# Patient Record
Sex: Female | Born: 1990 | Hispanic: Yes | Marital: Married | Smoking: Never smoker
Health system: Southern US, Community
[De-identification: ages and names within clinical notes are randomized; demographics above are authoritative.]

---
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
## 2012-03-05 ENCOUNTER — Encounter: Payer: Self-pay | Admitting: Obstetrics & Gynecology

## 2012-03-05 ENCOUNTER — Ambulatory Visit (INDEPENDENT_AMBULATORY_CARE_PROVIDER_SITE_OTHER): Payer: Self-pay | Admitting: Obstetrics & Gynecology

## 2012-03-05 VITALS — BP 120/73 | HR 60 | Temp 97.9°F | Ht 61.0 in | Wt 127.5 lb

## 2012-03-05 DIAGNOSIS — N926 Irregular menstruation, unspecified: Secondary | ICD-10-CM

## 2012-03-05 DIAGNOSIS — L680 Hirsutism: Secondary | ICD-10-CM

## 2012-03-05 NOTE — Progress Notes (Signed)
Patient ID: Alyssa Lynch, female   DOB: 08-Mar-1991, 21 y.o.   MRN: [id number]  Chief Complaint  Patient presents with  . Abnormal bleeding    HPI Alyssa Lynch is a 21 y.o. female.  [id number] Patient's last menstrual period was 02/29/2012. Since menarche at age 51 she has had irregular menses occasionally skipping a month or 2. She's been married for over a year and a half and used no contraception and has been unable to conceive. Her husband also has no children. There is no history of sexually-transmitted diseases.  HPI  History reviewed. No pertinent past medical history.  History reviewed. No pertinent past surgical history.  History reviewed. No pertinent family history.  Social History History  Substance Use Topics  . Smoking status: Never Smoker   . Smokeless tobacco: Never Used  . Alcohol Use: Yes     socially    No Known Allergies  Current Outpatient Prescriptions  Medication Sig Dispense Refill  . Ibuprofen (MIDOL) 200 MG CAPS Take 1 capsule by mouth.        Review of Systems Review of Systems  Constitutional: Negative for activity change and fatigue.  Gastrointestinal: Negative for abdominal pain.  Genitourinary: Negative for vaginal discharge, vaginal pain and dyspareunia.  Skin:       She has some facial hirsutism and she has had electrolysis in the past.  Neurological: Negative for headaches.  Psychiatric/Behavioral: Negative for agitation.    Blood pressure 120/73, pulse 60, temperature 97.9 F (36.6 C), height 5\' 1"  (1.549 m), weight 127 lb 8 oz (57.834 kg), last menstrual period 02/29/2012.  Physical Exam Physical Exam  Constitutional: She is oriented to person, place, and time. She appears well-developed and well-nourished. No distress.  Pulmonary/Chest: Effort normal.  Abdominal: Soft. She exhibits no distension and no mass. There is no tenderness.  Genitourinary: Vagina normal and uterus normal. No vaginal discharge (light to moderate  menstrual bleeding. No adnexal masses) found.  Neurological: She is alert and oriented to person, place, and time.  Skin: Skin is warm and dry.  Psychiatric: She has a normal mood and affect.    Data Reviewed   Assessment    Irregular menses, facial hirsutism. Patient may be oligo-ovulatory. We will check for serum androgens and TSH    Plan    Return to clinic in one month and review her lab tests and address for infertility issues. For now she would prefer not to be on oral contraceptives for cycle control.       ARNOLD,JAMES 03/05/2012, 4:15 PM

## 2012-03-05 NOTE — Patient Instructions (Signed)

## 2012-03-05 NOTE — Progress Notes (Signed)
States has abnormal periods- usually skips a month- has always been that way since periods started.   States has been trying to get pregnant for  A year and a half.

## 2012-03-06 LAB — DHEA-SULFATE: DHEA-SO4: 377 ug/dL (ref 35–430)

## 2012-03-06 LAB — TSH: TSH: 0.818 u[IU]/mL (ref 0.350–4.500)

## 2012-04-04 ENCOUNTER — Encounter: Payer: Self-pay | Admitting: Obstetrics and Gynecology

## 2012-04-23 ENCOUNTER — Encounter: Payer: Self-pay | Admitting: Obstetrics & Gynecology

## 2012-04-23 ENCOUNTER — Ambulatory Visit (INDEPENDENT_AMBULATORY_CARE_PROVIDER_SITE_OTHER): Payer: Self-pay | Admitting: Obstetrics & Gynecology

## 2012-04-23 VITALS — BP 135/89 | HR 77 | Temp 97.0°F | Ht 62.0 in | Wt 124.5 lb

## 2012-04-23 DIAGNOSIS — N970 Female infertility associated with anovulation: Secondary | ICD-10-CM

## 2012-04-23 MED ORDER — METFORMIN HCL ER 500 MG PO TB24: ORAL_TABLET | ORAL | Status: DC

## 2012-04-23 MED ORDER — MEDROXYPROGESTERONE ACETATE 10 MG PO TABS: ORAL_TABLET | ORAL | Status: DC

## 2012-04-23 NOTE — Progress Notes (Signed)
  Subjective:    Patient ID: Alyssa Lynch, female    DOB: 1991-06-06, 21 y.o.   MRN: [id number]  HPI  Alyssa Lynch is a 21 yo MH G0 who is here for infertility. She and her husband have been having unprotected sex for about 4 years. Her periods are quite irregular. Dr. Debroah Loop ordered TSH and test. Studies at her last visit. They were normal.  Review of Systems     Objective:   Physical Exam        Assessment & Plan:  Infertility. I have sent a prescription to a [facility] office for her husband to have a vasectomy. She has instructions. In the meantime, I will give her provera the first 10 days of each month to regulate her bleeding. She will also start metformin, gradually increasing her dose to reach a goal of 1000 mg BID

## 2012-09-29 ENCOUNTER — Encounter (HOSPITAL_COMMUNITY): Payer: Self-pay | Admitting: *Deleted

## 2012-09-29 ENCOUNTER — Emergency Department (HOSPITAL_COMMUNITY)
Admission: EM | Admit: 2012-09-29 | Discharge: 2012-09-29 | Disposition: A | Discharge status: Home / Self Care | Payer: No Typology Code available for payment source | Attending: Emergency Medicine | Admitting: Emergency Medicine

## 2012-09-29 DIAGNOSIS — M546 Pain in thoracic spine: Secondary | ICD-10-CM

## 2012-09-29 DIAGNOSIS — Y92410 Unspecified street and highway as the place of occurrence of the external cause: Secondary | ICD-10-CM | POA: Insufficient documentation

## 2012-09-29 DIAGNOSIS — Y9389 Activity, other specified: Secondary | ICD-10-CM | POA: Insufficient documentation

## 2012-09-29 DIAGNOSIS — IMO0002 Reserved for concepts with insufficient information to code with codable children: Secondary | ICD-10-CM | POA: Insufficient documentation

## 2012-09-29 DIAGNOSIS — Z79899 Other long term (current) drug therapy: Secondary | ICD-10-CM | POA: Insufficient documentation

## 2012-09-29 MED ORDER — IBUPROFEN 600 MG PO TABS: 600.0000 mg | ORAL_TABLET | Freq: Three times a day (TID) | ORAL | Status: DC | PRN
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
## 2014-08-16 ENCOUNTER — Ambulatory Visit: Payer: Self-pay | Admitting: Gynecology

## 2014-09-07 ENCOUNTER — Ambulatory Visit: Payer: Self-pay | Admitting: Gynecology

## 2014-09-24 ENCOUNTER — Ambulatory Visit: Payer: Self-pay | Admitting: Gynecology

## 2014-10-13 ENCOUNTER — Ambulatory Visit (INDEPENDENT_AMBULATORY_CARE_PROVIDER_SITE_OTHER): Payer: Self-pay | Admitting: Gynecology

## 2014-10-13 ENCOUNTER — Other Ambulatory Visit (HOSPITAL_COMMUNITY)
Admission: RE | Admit: 2014-10-13 | Discharge: 2014-10-13 | Disposition: A | Discharge status: Home / Self Care | Payer: No Typology Code available for payment source | Source: Ambulatory Visit | Attending: Gynecology | Admitting: Gynecology

## 2014-10-13 ENCOUNTER — Encounter: Payer: Self-pay | Admitting: Gynecology

## 2014-10-13 VITALS — BP 130/86 | Ht 61.25 in | Wt 145.0 lb

## 2014-10-13 DIAGNOSIS — L680 Hirsutism: Secondary | ICD-10-CM

## 2014-10-13 DIAGNOSIS — N912 Amenorrhea, unspecified: Secondary | ICD-10-CM

## 2014-10-13 DIAGNOSIS — Z113 Encounter for screening for infections with a predominantly sexual mode of transmission: Secondary | ICD-10-CM

## 2014-10-13 DIAGNOSIS — Z01419 Encounter for gynecological examination (general) (routine) without abnormal findings: Secondary | ICD-10-CM

## 2014-10-13 DIAGNOSIS — N979 Female infertility, unspecified: Secondary | ICD-10-CM

## 2014-10-13 LAB — CBC WITH DIFFERENTIAL/PLATELET
Basophils Absolute: 0 10*3/uL (ref 0.0–0.1)
Basophils Relative: 0 % (ref 0–1)
EOS PCT: 1 % (ref 0–5)
Eosinophils Absolute: 0.1 10*3/uL (ref 0.0–0.7)
HCT: 42 % (ref 36.0–46.0)
Hemoglobin: 14.2 g/dL (ref 12.0–15.0)
Lymphocytes Relative: 34 % (ref 12–46)
Lymphs Abs: 2 10*3/uL (ref 0.7–4.0)
MCH: 27 pg (ref 26.0–34.0)
MCHC: 33.8 g/dL (ref 30.0–36.0)
MCV: 79.8 fL (ref 78.0–100.0)
MONO ABS: 0.4 10*3/uL (ref 0.1–1.0)
MONOS PCT: 6 % (ref 3–12)
MPV: 9.7 fL (ref 8.6–12.4)
NEUTROS ABS: 3.5 10*3/uL (ref 1.7–7.7)
Neutrophils Relative %: 59 % (ref 43–77)
PLATELETS: 255 10*3/uL (ref 150–400)
RBC: 5.26 MIL/uL — ABNORMAL HIGH (ref 3.87–5.11)
RDW: 14.1 % (ref 11.5–15.5)
WBC: 6 10*3/uL (ref 4.0–10.5)

## 2014-10-13 LAB — PREGNANCY, URINE: Preg Test, Ur: NEGATIVE

## 2014-10-13 LAB — TSH: TSH: 1.536 u[IU]/mL (ref 0.350–4.500)

## 2014-10-13 MED ORDER — MEDROXYPROGESTERONE ACETATE 10 MG PO TABS: ORAL_TABLET | ORAL | Status: DC

## 2014-10-13 MED ORDER — DOXYCYCLINE HYCLATE 100 MG PO CAPS: 100.0000 mg | ORAL_CAPSULE | Freq: Two times a day (BID) | ORAL | Status: DC

## 2014-10-13 NOTE — Patient Instructions (Signed)
Histerosalpingografa (Hysterosalpingography) La histerosalpingografa es un procedimiento para observar el interior del tero y las trompas de Falopio. Durante este procedimiento, se inyecta una sustancia de contraste en el tero, a travs de la vagina y el cuello del tero para poder visualizar el tero mientras se toman imgenes radiogrficas. Este procedimiento puede ayudar al mdico a diagnosticar tumores, adherencias o anormalidades estructurales en el tero. Generalmente, se utiliza para determinar las razones por las que una mujer no puede tener hijos (infertilidad). El procedimiento suele durar entre 15 y 30 minutos. INFORME A SU MDICO:  Cualquier alergia que tenga.  Todos los medicamentos que utiliza, incluidos vitaminas, hierbas, gotas oftlmicas, cremas y medicamentos de venta libre.  Problemas previos que usted o los miembros de su familia hayan tenido con el uso de anestsicos.  Enfermedades de la sangre.  Cirugas previas.  Afecciones mdicas que tenga. RIESGOS Y COMPLICACIONES  En general, se trata de un procedimiento seguro. Sin embargo, como en cualquier procedimiento, pueden surgir problemas. Estos son algunos posibles problemas:  Infeccin en el revestimiento interior del tero (endometritis) o en las trompas de Falopio (salpingitis).  Dao o perforacin del tero o las trompas de Falopio.  Reaccin alrgica a la sustancia de contraste utilizada para tomar la radiografa. ANTES DEL PROCEDIMIENTO   Debe planificar el procedimiento para despus que finalice su perodo, pero antes de su prxima ovulacin. Esto ocurre por lo general entre los das 5 y 10 de su ltimo perodo. El Da 1 es el primer da de su perodo.  Consulte a su mdico si debe cambiar o suspender los medicamentos que toma habitualmente.  Podr comer y beber normalmente.  Antes de comenzar el procedimiento, vace la vejiga. PROCEDIMIENTO  Es posible que le administren un medicamento para relajarse  (sedante) o un analgsico de venta libre para aliviar las molestias durante el procedimiento.  Deber acostarse en una mesa de radiografas con los pies en los estribos.  Le colocarn en la vagina un dispositivo llamado espculo. Esto permite al mdico observar el interior de la vagina hasta el cuello del tero.  El cuello del tero se lava con un jabn especial.  Luego se pasa un tubo delgado y flexible a travs del cuello del tero hasta el tero.  Por el tubo se colocar una sustancia de contraste.  Le tomarn varias radiografas a medida que el contraste pasa a travs del tero y las trompas de Falopio.  Despus del procedimiento se retira el tubo. DESPUS DEL PROCEDIMIENTO   La mayor parte de la sustancia de contraste se eliminar de la vagina naturalmente. Puede ser necesario que use un apsito sanitario.  Puede sentir clicos leves y notar una hemorragia vaginal leve. Luego de 24 horas deben desaparecer.  Pregunte cundo estarn listos los resultados. Asegrese de obtener los resultados. Document Released: 10/15/2011 Document Revised: 07/28/2013 ExitCare Patient Information 2015 [employer]. This information is not intended to replace advice given to you by your health care provider. Make sure you discuss any questions you have with your health care provider. Infertilidad (Infertility) QU ES LA INFERTILIDAD?  La infertilidad normalmente se define como la incapacidad para quedar embarazada luego de un ao de relaciones sexuales regulares sin la utilizacin de mtodos anticonceptivos. O la incapacidad de llevar a trmino un embarazo y tener el beb. La tasa de infertilidad en los [place] es de alrededor del 10%. El embarazo es el resultado de una cadena de sucesos. La mujer debe liberar el vulo de uno de sus ovarios (ovulacin).   El vulo debe fertilizarse con el esperma. Luego viaja a travs de las trompas de Falopio hacia el tero (matriz), donde se une a la pared del  tero y crece. El hombre debe tener suficiente esperma y el esperma debe unirse con el vulo (fertilizar) en el momento justo. El vulo fertilizado debe luego unirse al interior del tero. Esto parece simple, pero pueden ocurrir muchas cosas que evitan que el embarazo se produzca.  DE QUIN ES EL PROBLEMA?  Un 20% de los casos de infertilidad se deben a problemas del hombres (factores masculinos) y un 65% se debe a problemas de la mujer (factores femeninos). Otras causas pueden ser una combinacin de factores masculinos y femeninos o a causas desconocidas.  CULES SON LAS CAUSAS DE LA INFERTILIDAD EN EL HOMBRE?  La infertilidad en el hombre a menudo se debe a problemas para producir el esperma o hacer que el mismo llegue al vulo. Los problemas de esperma pueden existir desde el nacimiento o desarrollarse ms tarde debido a enfermedades o lesiones. Algunos hombres no producen esperma, o producen muy poco (oligospermia). Entre otros problemas se incluyen:  Disfuncin sexual  Problemas hormonales o endocrinos.  La edad. La fertilidad del hombre disminuye con la edad, pero no tan temprano como la femenina.  Infecciones.  Problemas congnitos Defectos de nacimiento, como ausencia de los tubos que transportan el esperma (conductos deferentes).  Problemas genticos o de cromosomas.  Problemas de anticuerpos antiesperma.  Eyaculacin retrgrada (el esperma va hacia la vejiga).  Varicoceles, espermatoceles o tumores en los testculos.  El estilo de vida puede influir en el nmero y la calidad del esperma del hombre.  El alcohol y las drogas pueden reducir temporalmente la calidad del esperma.  Las toxinas ambientales, como los pesticidas y el plomo, pueden ocasionar algunos casos de infertilidad en hombres. CULES SON LAS CAUSAS DE LA INFERTILIDAD EN LA MUJER?   La infertilidad en las mujeres est causada mayormente por problemas con la ovulacin. Sin ovulacin, el vulo no puede  fertilizarse.  Seales de problemas de ovulacin son perodos menstruales irregulares o ningn perodo.  Factores simples del estilo de vida, como el estrs, la dieta, o el entrenamiento deportivo, pueden afectar el balance hormonal de una persona.  La edad. La fertilidad comienza a disminuir en las mujeres alrededor de los 30 aos y empeora a partir de los 37.  Mucho menos a menudo, un desequilibrio hormonal por un problema mdico serio como un tumor en la glndula pituitaria, tiroides u otra enfermedad mdica crnica pueden ocasionar problemas de ovulacin.  Infecciones plvicas.  Sndrome de ovarios poliqusticos (aumento de hormonas masculinas, incapacidad de ovular).  Consumo de alcohol o drogas.  Toxinas ambientales, radiacin, pesticidas y ciertos qumicos.  La edad es un factor importante en la infertilidad femenina.  La capacidad de los ovarios de una mujer para producir vulos disminuye con la edad, especialmente despus de los 35 aos. Alrededor de un tercio de las parejas en las que la mujer tiene ms de 35 aos tendr problemas de infertilidad.  En el momento en el que alcanza la menopausia, cuando su perodo menstrual se detiene, la mujer ya no puede producir vulos ni quedar embarazada.  Otros problemas tambin pueden llevar a la infertilidad en las mujeres. Si las trompas de Falopio estn bloqueadas en uno o los dos extremos, el vulo no puede pasar a travs de los tubos hacia el tero. Tejido cicatrizal (adhesiones) en la pelvis que pueden obstruir las trompas. Esto puede dar como resultado   una enfermedad inflamatoria plvica, endometriosis o una ciruga por embarazo ectpico (en la que el vulo fertilizado se ha implantado fuera del tero) o cualquier ciruga plvica o abdominal que ocasione adherencias.  Tumores fibroides o plipos en el tero.  Anormalidades congnitas (de nacimiento) del tero.  Infecciones en el cuello del tero (cervicitis).  Estenosis cervical  (estrechamiento).  Mucosidad cervical anormal.  Sndrome poliqustico de los ovarios.  Tener relaciones sexuales muy seguidas (todos los das o 4 a 5 veces por semana).  Obesidad.  Anorexia.  Dficit nutricional.  Mucho ejercicio, con prdida de grasa corporal.  DES. Su madre ha recibido la hormona dietilstilbesterol cuando estaba embarazada de usted. CMO SE ANALIZA LA INFERTILIDAD?  Si ha estado tratando de quedar embarazada sin xito, podra querer buscar ayuda mdica. No debera esperar un ao de intentos sin xito antes de buscar ayuda profesional si:  Tiene mas de 35 aos de edad  Tiene razones para creer que podra tener problemas de fertilidad. Un examen mdico determinar las causas de infertilidad de la pareja, Normalmente el proceso comienza con:  Exmenes fsicos  Historias clnicas de ambos miembros de la pareja.  Historiales sexuales de ambos miembros de la pareja. Si no hay un problema evidente, como relaciones sexuales en tiempos inadecuados o ausencia de ovulacin, se necesitarn realizar anlisis.   Para el hombre, los anlisis normalmente comienzan con pruebas de semen para observar:  La cantidad de esperma.  La forma del esperma.  El movimiento del esperma.  Realizar un historial clnico y quirrgico completo.  Examen fsico.  Control de infecciones en los rganos reproductivos. Podrn realizarle anlisis hormonales.   Para la mujer, el primer paso del anlisis es saber si ovula cada mes. Hay diferentes modos de realizar esto. Por ejemplo, puede llevar un registro de los cambios en la temperatura corporal por la maana y la textura de la mucosidad cervical. Otra herramienta es un kit de prueba de ovulacin casero, que puede comprarse en la farmacia.  Tambin pueden realizarse controles de ovulacin en el consultorio mdico, mediante anlisis de sangre para observar los niveles de hormonas o pruebas de ultrasonido en los ovarios. Si la mujer est  ovulando, se necesitarn realizar ms exmenes. Algunas femeninas comunes incluyen:  Histerosalpingografa: Se realizan rayos x de las trompas de Falopio y el tero con una inyeccin de contraste. Mostrar si las trompas estn abiertas y la forma del tero.  Laparoscopa: Un anlisis de las trompas y otros rganos femeninos para encontrar enfermedades. Se utiliza un tubo con iluminacin llamado laparoscopio para observar el interior del abdomen.  Biopsia endometrial: Se toma una muestra del tejido del tero el primer da del perodo menstrual, para ver si el tejido le indica si est ovulando.  Prueba de ultrasonido transvaginal: Examina los rganos femeninos.  Histeroscopa: Utiliza un tubo con iluminacin para examinar el cuello del tero y el tero y ver si hay anormalidades dentro del mismo. TRATAMIENTO Dependiendo de los resultados de las pruebas, se sugerirn tratamientos diferentes. El tratamiento depende de la causa. Entre el 85 y el 90% de los casos de infertilidad se tratan con drogas o ciruga.   Hay varias drogas para la fertilidad que pueden utilizarse para las mujeres con problemas de ovulacin. Es importante hablar con el profesional que la asiste sobre la droga a utilizar. Deber comprender los beneficios y efectos colaterales de las drogas. Segn el tipo de droga para la fertilidad y la dosis utilizada, algunas mujeres podran tener embarazos mltiples (mellizos).  De ser   necesario, se puede realizar una ciruga para reparar daos en los ovarios de la mujer, trompas de Falopio, el cuello del tero o el tero.  Tratamiento quirrgico o mdico para la endometriosis o el sndrome de ovario poliqustico. A veces, los problemas de fertilidad en el hombre pueden corregirse con medicamentos o ciruga.  Inseminacin intrauterina, IUI, de esperma en concordancia con la ovulacin.  Cambios en el estilo de vida, si esta es la causa (prdida de peso, aumento de ejercicios, dejar de fumar,  beber excesivamente o tomar drogas ilegales).  Otros tipos de ciruga:  Extirpar tumores por dentro o sobre el tero.  Eliminar tejido cicatrizal de dentro del tero.  Arreglar trompas obstruidas.  Eliminar tejido cicatrizal en la pelvis y alrededor de los rganos femeninos. QU ES LA TECNOLOGA DE REPRODUCCIN ASISTIDA (ART)?  La tecnologa de reproduccin asistida (ART) utiliza mtodos especiales para ayudar a parejas infrtiles. En esta tcnica se manipula tanto el vulo de la mujer como el esperma del hombre. El xito depende de muchos factores. La ART puede ser cara y demandar mucho tiempo. Pero ha hecho posible que muchas parejas tengan hijos que de otra manera no hubieran podido. A continuacin se enumeran algunos mtodos:  Fertilizacin. In Vitro (FIV). In Vitro (IVF) es un procedimiento que se hizo famoso con el nacimiento en 1978 de Louise Brown, el primer "beb de probeta" del mundo. Se utiliza cuando las trompas de Falopio de la mujer estn bloqueadas o cuando el hombre tiene poco esperma. Se utiliza una droga para estimular los ovarios y producir mltiples vulos. Una vez maduros, los vulos se retiran y se colocan en una placa de cultivo con el esperma del hombre para la fertilizacin. Luego de aproximadamente 40 horas, los vulos se examinan para ver si han sido fertilizados por el esperma y se han dividido en clulas. Esos vulos fertilizados (embriones) se colocan luego en el tero de la mujer. Esto evita el paso por las trompas de Falopio.  La transferencia intrafalopiana de gametas (GIFT) es similar al IVF, pero se utiliza cuando la mujer tiene al menos una trompa de falopio normal. Se colocan de tres a cinco vulos en la trompa de Falopio, junto con el esperma del hombre, para que la fertilizacin se realice dentro del cuerpo de la mujer.  La transferencia intrafalopiana de cigotos (ZIFT), tambin se denomina transferencia embrionaria, y combina el IVF con el GIFT. Los vulos  retirados de los ovarios de la mujer se fertilizan en el laboratorio y se colocan en las trompas de Falopio en lugar de en el tero.  Los procedimientos de ART a menudo suponen la utilizacin de donantes de vulos (de otra mujer) o de embriones congelados previamente. Los donantes de vulos pueden utilizarse si la mujer tiene problemas en los ovarios o posee una enfermedad gentica que podra transmitirla al beb.  Cuando se realiza una ART hay mayor riesgo de embarazos mltiples, mellizos, trillizos o ms.  La inyeccin de esperma intracitoplasmtica es un procedimiento que inyecta un espermatozoide en el vulo para fertilizarlo.  El trasplante embrionario es un procedimiento que comienza con un embrin que se ha desarrollado en un medio especial (solucin qumica) preparado para mantener el embrin vivo por 2 a 5 das, y luego trasplantarlo en el tero. En los casos en los que no puede encontrarse la causa y el embarazo no se produce, podr considerarse la adopcin. Document Released: 08/12/2007 Document Revised: 10/15/2011 Exit[employer] Patient Information 2015 [employer]. This information is not intended to replace   advice given to you by your health care provider. Make sure you discuss any questions you have with your health care provider.

## 2014-10-13 NOTE — Addendum Note (Signed)
Addended by: Berna SpareASTILLO, Reichen Hutzler A on: 10/13/2014 12:29 PM   Modules accepted: Orders

## 2014-10-13 NOTE — Progress Notes (Signed)
Alyssa Lynch 1990/12/10 [id number]   History:    24 y.o.  for annual gyn exam who is a new patient to the practice. Patient complaining of going several months without a menstrual cycle. Patient does not recall when her last menstrual period was. She states that the longest she has gone without a menstrual cycle is 3 months. She has had history of primary infertility as well as hirsutism. It appears that she was placed on metformin for a short time and patient does not recall why. She has never been placed on any ovulation induction medication are undergone any infertility evaluation. She does say that several of her family members do have excessive hair growth. She denies any past history of any STDs or any surgery. Her husband is 24 years of age and has not had children with any one else. She reports having normal development and starting her menstrual cycle the age of 89. She denies any nipple discharge, headaches or blurry vision. We did a urine pregnancy test here in the office today which was negative. It appears that she had a normal total testosterone and DHEAS and TSH back in 2013.  Past medical history,surgical history, family history and social history were all reviewed and documented in the EPIC chart.  Gynecologic History Patient's last menstrual period was 07/14/2014. Contraception: none Last Pap: 2014. Results were: Patient states it was normal Last mammogram: Not indicated. Results were: Not indicated  Obstetric History OB History  Gravida Para Term Preterm AB SAB TAB Ectopic Multiple Living  0 0 0 0 0 0 0 0 0 0          ROS: A ROS was performed and pertinent positives and negatives are included in the history.  GENERAL: No fevers or chills. HEENT: No change in vision, no earache, sore throat or sinus congestion. NECK: No pain or stiffness. CARDIOVASCULAR: No chest pain or pressure. No palpitations. PULMONARY: No shortness of breath, cough or wheeze.  GASTROINTESTINAL: No abdominal pain, nausea, vomiting or diarrhea, melena or bright red blood per rectum. GENITOURINARY: No urinary frequency, urgency, hesitancy or dysuria. MUSCULOSKELETAL: No joint or muscle pain, no back pain, no recent trauma. DERMATOLOGIC: No rash, no itching, no lesions. ENDOCRINE: No polyuria, polydipsia, no heat or cold intolerance. No recent change in weight. HEMATOLOGICAL: No anemia or easy bruising or bleeding. NEUROLOGIC: No headache, seizures, numbness, tingling or weakness. PSYCHIATRIC: No depression, no loss of interest in normal activity or change in sleep pattern.     Exam: chaperone present  BP 130/86 mmHg  Ht 5' 1.25" (1.556 m)  Wt 145 lb (65.772 kg)  BMI 27.17 kg/m2  LMP 07/14/2014  Body mass index is 27.17 kg/(m^2).  General appearance : Well developed well nourished female. No acute distress HEENT: Eyes: no retinal hemorrhage or exudates,  Neck supple, trachea midline, no carotid bruits, no thyroidmegaly Lungs: Clear to auscultation, no rhonchi or wheezes, or rib retractions  Heart: Regular rate and rhythm, no murmurs or gallops Breast:Examined in sitting and supine position were symmetrical in appearance, no palpable masses or tenderness,  no skin retraction, no nipple inversion, no nipple discharge, no skin discoloration, no axillary or supraclavicular lymphadenopathy Abdomen: no palpable masses or tenderness, no rebound or guarding Extremities: no edema or skin discoloration or tenderness  Pelvic:  Bartholin, Urethra, Skene Glands: Within normal limits             Vagina: No gross lesions or discharge  Cervix: No gross lesions or discharge  Uterus  anteverted, normal size, shape and consistency, non-tender and mobile  Adnexa  Without masses or tenderness  Anus and perineum  normal   Rectovaginal  normal sphincter tone without palpated masses or tenderness             Hemoccult not indicated     Assessment/Plan:  24 y.o. female for annual  exam with oligomenorrhea. 2 we are going to check the following blood work: TSH, prolactin, total testosterone, along with CBC and urinalysis. Patient will be prescribed Provera 10 mg to take 1 by mouth daily to jump start her cycle. She will then call the office at that we can schedule an HSG as well as a semen analysis for her husband. She will be prescribed Vibramycin 100 mg take 1 by mouth twice a day for 3 days starting the day before the HSG. She was instructed to begin taking her prenatal vitamins. Literature information was provided on infertility as well as on HSG in Spanish.   Ok Edwards MD, 12:20 PM 10/13/2014

## 2014-10-13 NOTE — Addendum Note (Signed)
Addended by: Berna SpareASTILLO, Jenie Parish A on: 10/13/2014 03:44 PM   Modules accepted: Orders

## 2014-10-14 LAB — URINALYSIS W MICROSCOPIC + REFLEX CULTURE
Bacteria, UA: NONE SEEN
Bilirubin Urine: NEGATIVE
Casts: NONE SEEN
Crystals: NONE SEEN
Glucose, UA: NEGATIVE mg/dL
Hgb urine dipstick: NEGATIVE
Ketones, ur: NEGATIVE mg/dL
LEUKOCYTES UA: NEGATIVE
Nitrite: NEGATIVE
PROTEIN: NEGATIVE mg/dL
SQUAMOUS EPITHELIAL / LPF: NONE SEEN
Specific Gravity, Urine: 1.01 (ref 1.005–1.030)
Urobilinogen, UA: 0.2 mg/dL (ref 0.0–1.0)
pH: 6 (ref 5.0–8.0)

## 2014-10-14 LAB — TESTOSTERONE: Testosterone: 71 ng/dL — ABNORMAL HIGH (ref 10–70)

## 2014-10-14 LAB — PROLACTIN: Prolactin: 5.4 ng/mL

## 2014-10-15 LAB — GC/CHLAMYDIA PROBE AMP
CT Probe RNA: NEGATIVE
GC Probe RNA: NEGATIVE

## 2014-10-18 LAB — CYTOLOGY - PAP

## 2014-10-27 ENCOUNTER — Telehealth: Payer: Self-pay | Admitting: *Deleted

## 2014-10-27 DIAGNOSIS — N979 Female infertility, unspecified: Secondary | ICD-10-CM

## 2014-10-27 NOTE — Telephone Encounter (Signed)
[facility] 8:30am will need to be there at 8:15am to check in. The total out of pocket will be $802.00 which will need to be paid before procedure. Pt can come to [facility] day prior to HSG to have this paid and keep receipt to show the day of procedure so it can be scanned in epic. Will have claudia relay to patient.

## 2014-10-27 NOTE — Telephone Encounter (Signed)
-----   Message from Jerilynn Mageslaudia Shaffer sent at 10/26/2014  3:39 PM EDT ----- Regarding: hsg Contact: [phone] Please schedule HSG for JF patient. Her LMP 10-26-14 and she prefers early morning. She is self pay so can you please inform her of how much she needs to pay. Thx

## 2014-11-02 ENCOUNTER — Telehealth: Payer: Self-pay | Admitting: *Deleted

## 2014-11-02 ENCOUNTER — Ambulatory Visit (HOSPITAL_COMMUNITY)
Admission: RE | Admit: 2014-11-02 | Discharge: 2014-11-02 | Disposition: A | Discharge status: Home / Self Care | Payer: Self-pay | Source: Ambulatory Visit | Attending: Gynecology | Admitting: Gynecology

## 2014-11-02 DIAGNOSIS — N979 Female infertility, unspecified: Secondary | ICD-10-CM | POA: Insufficient documentation

## 2014-11-02 MED ORDER — IOHEXOL 300 MG/ML  SOLN
20.0000 mL | Freq: Once | INTRAMUSCULAR | Status: AC | PRN
  Administered 2014-11-02: 20 mL

## 2014-11-02 NOTE — Telephone Encounter (Signed)
Pt informed with the below note, front desk will contact to have semen analysis done.

## 2014-11-02 NOTE — Telephone Encounter (Signed)
All studies are normal. Did not see Semen analysis has been done. Once we have that we need a consult appointment to discuss ovulation induction treatment.

## 2014-11-02 NOTE — Telephone Encounter (Signed)
Pt HSG results on in epic, pt asked what is the next step? Please advise

## 2014-12-01 ENCOUNTER — Telehealth: Payer: Self-pay | Admitting: *Deleted

## 2014-12-01 NOTE — Telephone Encounter (Signed)
Pt called requesting husband semen analysis which was done today. I informed patient result will need to be faxed here for JF to review, pt will wait to here from us

## 2014-12-03 ENCOUNTER — Encounter: Payer: Self-pay | Admitting: Gynecology

## 2014-12-06 ENCOUNTER — Telehealth: Payer: Self-pay

## 2014-12-06 NOTE — Telephone Encounter (Signed)
I just saw note in Jennifer's inbox that read "Victorino DikeJennifer, please inform patient that I received her husband semen analysis and that it was abnormal. Low concentration and low count of sperm. I would recommend that we repeated in one week after abstinence for 72 hours. Then to make an appointment to see me to discuss all results week after the repeat semen analysis. "  I will contact patient with this.

## 2014-12-06 NOTE — Telephone Encounter (Signed)
Patient informed. [facility] Patient will call them Weds to schedule appt.

## 2014-12-06 NOTE — Telephone Encounter (Signed)
Calling for results of husband's semen analysis.  Was scanned in labs on 12/01/14. What to tell her?

## 2014-12-06 NOTE — Telephone Encounter (Signed)
Thanks

## 2015-05-02 ENCOUNTER — Encounter: Payer: Self-pay | Admitting: Gynecology

## 2015-05-02 ENCOUNTER — Ambulatory Visit (INDEPENDENT_AMBULATORY_CARE_PROVIDER_SITE_OTHER): Payer: Self-pay | Admitting: Gynecology

## 2015-05-02 VITALS — BP 124/70

## 2015-05-02 DIAGNOSIS — N979 Female infertility, unspecified: Secondary | ICD-10-CM

## 2015-05-02 DIAGNOSIS — N469 Male infertility, unspecified: Secondary | ICD-10-CM

## 2015-05-02 DIAGNOSIS — N912 Amenorrhea, unspecified: Secondary | ICD-10-CM

## 2015-05-02 LAB — PREGNANCY, URINE: PREG TEST UR: NEGATIVE

## 2015-05-02 MED ORDER — MEDROXYPROGESTERONE ACETATE 10 MG PO TABS: ORAL_TABLET | ORAL | Status: AC

## 2015-05-02 NOTE — Progress Notes (Signed)
   Patient is a 24 year old gravida 0 who was seen in the office as a new patient for annual exam and complaining of amenorrhea on 10/13/2014. During that office visit her urine pregnancy test was negative and she was prescribed Provera 10 mg to take 1 by mouth daily for 10 days. Her last menstrual period was reported to be April 17. Patient had denied any visual disturbances, headaches or any nipple discharge. Her blood work consisting of a CBC, TSH, prolactin were normal. Her TSH was slightly above normal at 71. In 2013 she had been tested with DHEAS and reported to be normal. She recently had an HSG and her husband had a semen analysis with result as follows:  Semen analysis high-volume, low concentration, low count  Hysterosalpingogram no intracavitary defect bilateral tubal patency with spillage of dye  Urine (today negative  Exam: Abdomen: Soft nontender no rebound or guarding Pelvic: Bartholin urethra Skene glands within normal limits Vagina: No lesions or discharge Cervix: No lesions or discharge Uterus: Anteverted normal size shape and consistency Adnexa: No palpable masses or tenderness Rectal exam not done  Assessment/plan: #1 secondary amenorrhea negative urine pregnancy test will be prescribed Provera 10 mg to take 1 by mouth daily for 10 days every 30 days if she does not have a spontaneous menses. She was instructed to check a urine pregnancy test before taking Provera. #2 patient husband we'll need to have a repeat semen analysis to make sure female factor infertility is not an issue as well as chronic anovulation from his palate.  After the above tests is repeated if still abnormal we'll refer him to urologist before we start ovulation induction medication. Patient was provided to use the ovulation predictor kit meanwhile with ongoing evaluation before ovulation induction medication. Instructions provided.

## 2015-06-12 IMAGING — RF DG HYSTEROGRAM
6 series · 6 of 6 positions shown · IV contrast (omnipaque)
Comparison: None.

FLUOROSCOPY TIME:  0 minutes 42 seconds

Number of acquired images: 6

CLINICAL DATA: Primary infertility.

EXAM:
HYSTEROSALPINGOGRAM
TECHNIQUE: Following cleansing of the cervix and vagina with Betadine solution,
a hysterosalpingogram was performed using a 5-French
hysterosalpingogram catheter and Omnipaque 300 contrast. The patient
tolerated the examination without difficulty.

[Series 1: run · 1 of 1 slices shown (1 of 6)]
[im 1/1]
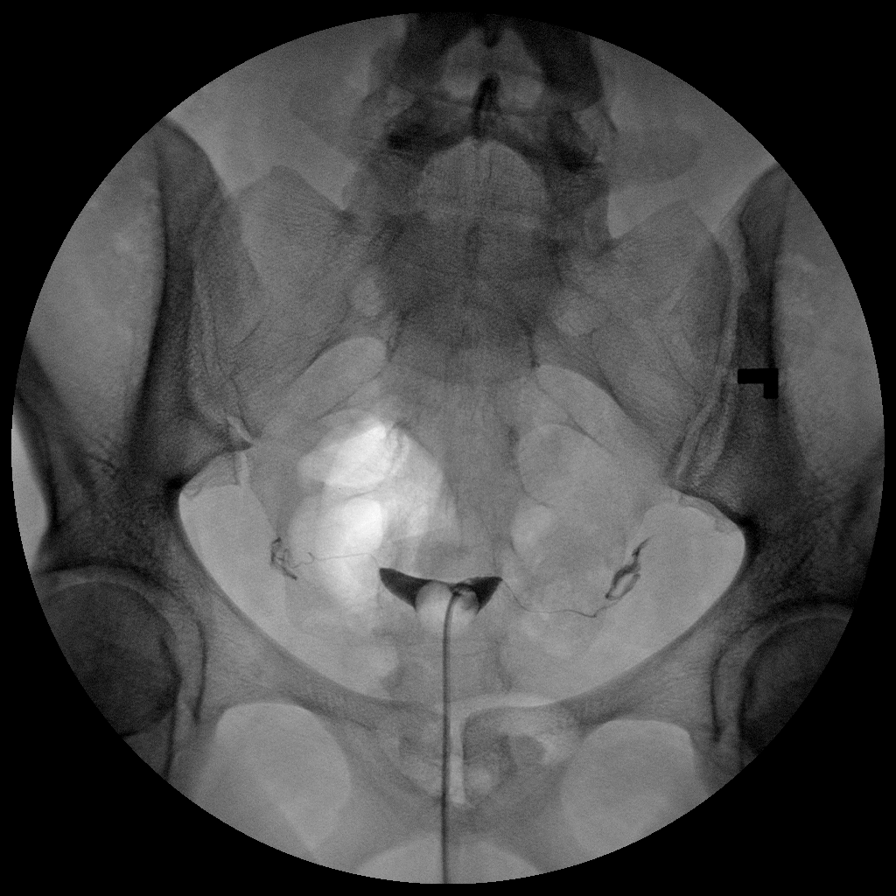

[Series 2: run · 1 of 1 slices shown (2 of 6)]
[im 1/1]
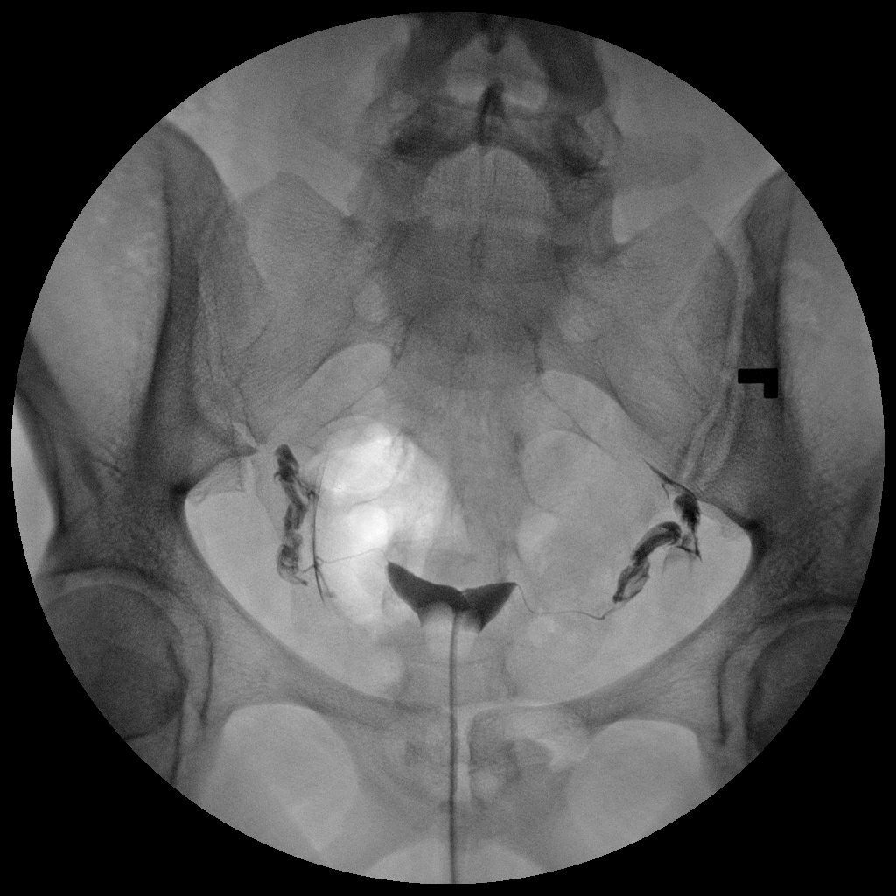

[Series 3: run · 1 of 1 slices shown (3 of 6)]
[im 1/1]
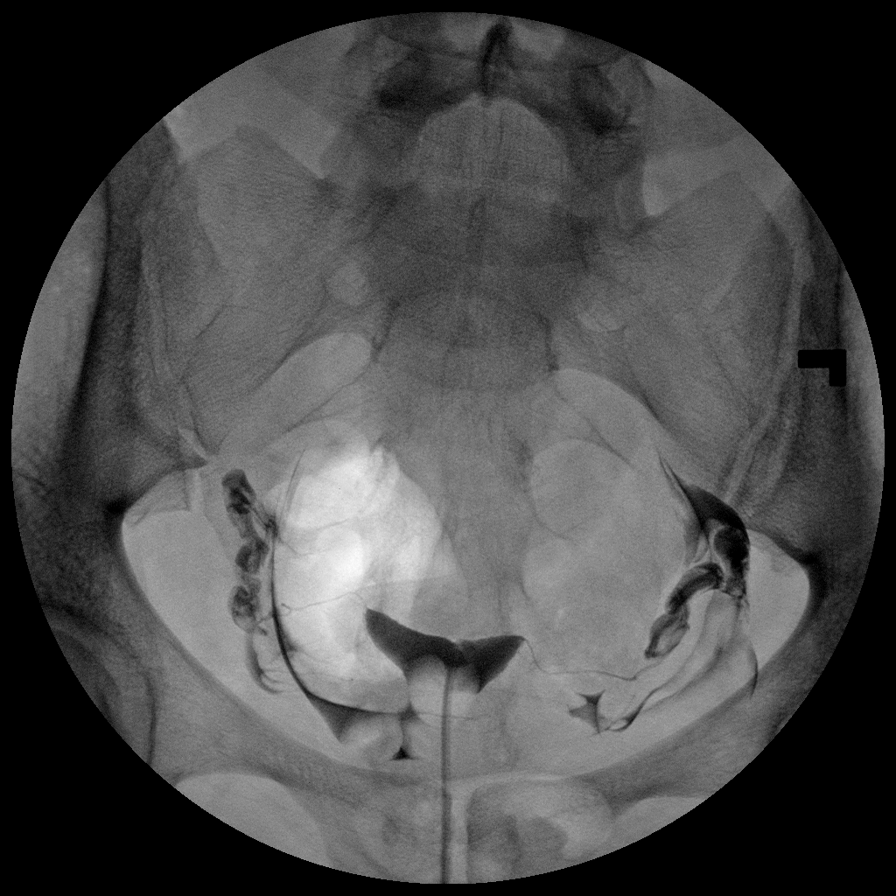

[Series 4: run · 1 of 1 slices shown (4 of 6)]
[im 1/1]
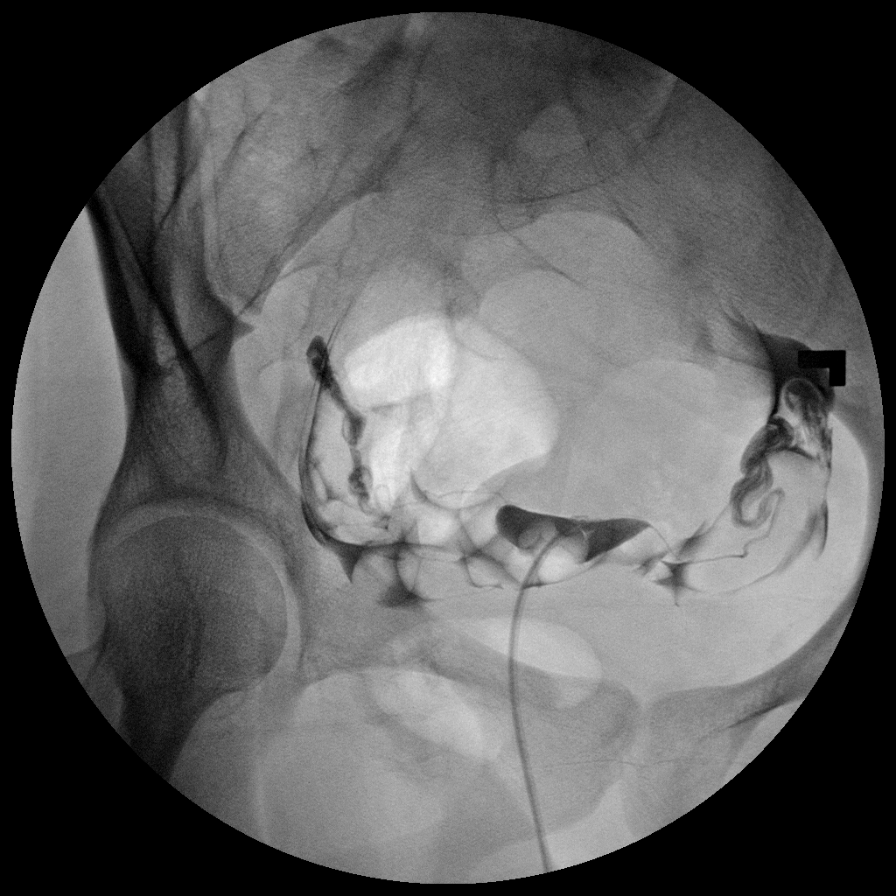

[Series 5: run · 1 of 1 slices shown (5 of 6)]
[im 1/1]
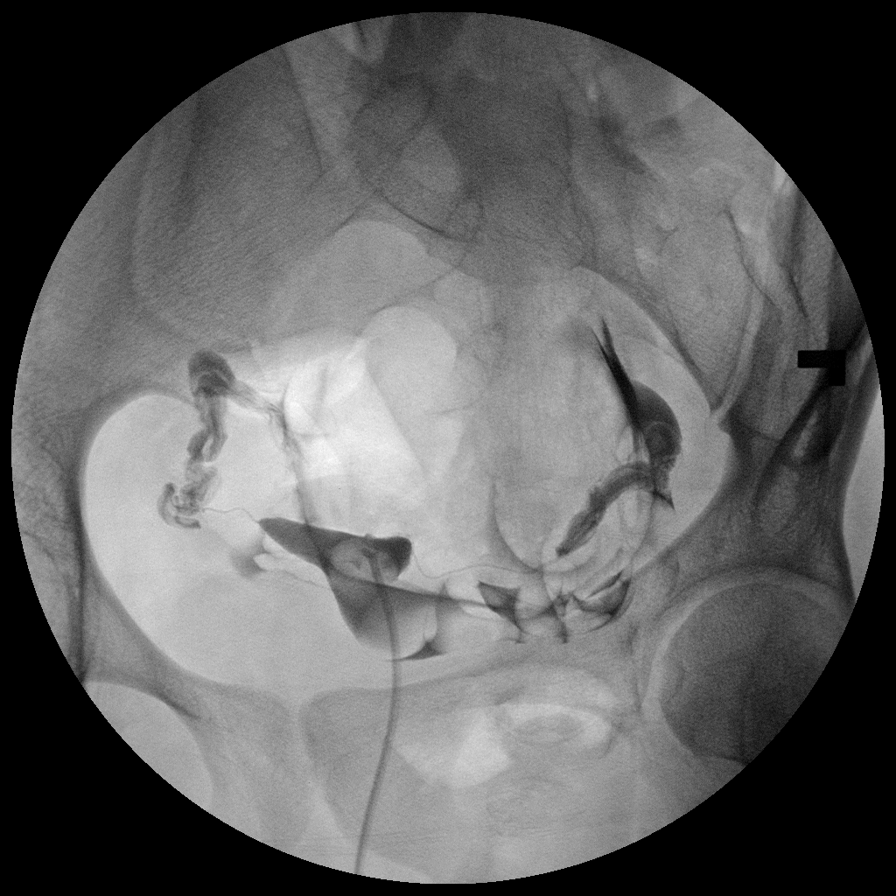

[Series 6: run · 1 of 1 slices shown (6 of 6)]
[im 1/1]
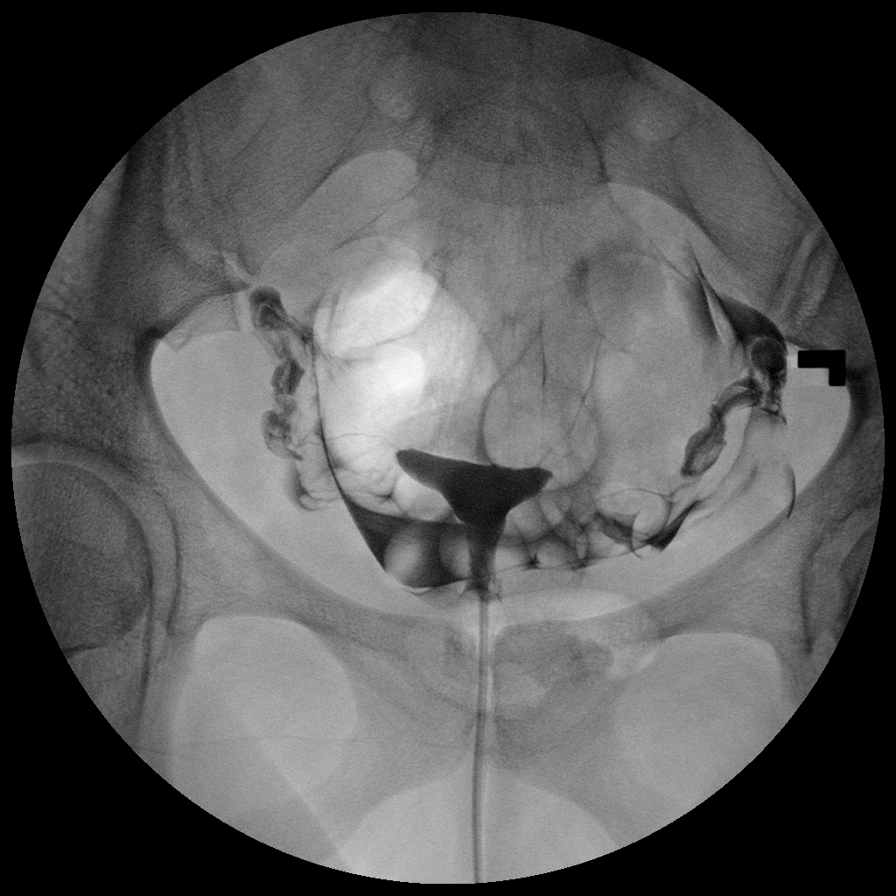

[6 of 6 positions shown; findings below may reference images not displayed]

FINDINGS: Endometrial Cavity: Normal appearance. No signs of Mullerian duct
anomaly or other significant abnormality.

Right Fallopian Tube: Well opacified and normal in appearance. Free
intraperitoneal spill of contrast is demonstrated.

Left Fallopian Tube: Well opacified and normal in appearance. Free
intraperitoneal spill of contrast is demonstrated.

Other:  None.
IMPRESSION: Normal study. Both fallopian tubes are patent.

## 2016-04-24 ENCOUNTER — Other Ambulatory Visit: Payer: Self-pay | Admitting: Obstetrics

## 2016-04-24 DIAGNOSIS — N915 Oligomenorrhea, unspecified: Secondary | ICD-10-CM

## 2016-04-27 ENCOUNTER — Ambulatory Visit
Admission: RE | Admit: 2016-04-27 | Discharge: 2016-04-27 | Disposition: A | Discharge status: Home / Self Care | Payer: BLUE CROSS/BLUE SHIELD | Source: Ambulatory Visit | Attending: Obstetrics | Admitting: Obstetrics

## 2016-04-27 DIAGNOSIS — N915 Oligomenorrhea, unspecified: Secondary | ICD-10-CM

## 2016-12-19 ENCOUNTER — Encounter: Payer: Self-pay | Admitting: Gynecology

## 2019-08-31 ENCOUNTER — Ambulatory Visit: Payer: Self-pay | Attending: Internal Medicine

## 2019-08-31 DIAGNOSIS — Z20822 Contact with and (suspected) exposure to covid-19: Secondary | ICD-10-CM

## 2019-09-01 LAB — NOVEL CORONAVIRUS, NAA: SARS-CoV-2, NAA: NOT DETECTED

## 2020-02-11 ENCOUNTER — Other Ambulatory Visit: Payer: Self-pay

## 2020-02-11 ENCOUNTER — Ambulatory Visit (HOSPITAL_COMMUNITY)
Admission: EM | Admit: 2020-02-11 | Discharge: 2020-02-11 | Disposition: A | Discharge status: Home / Self Care | Payer: BC Managed Care – PPO | Attending: Urgent Care | Admitting: Urgent Care

## 2020-02-11 ENCOUNTER — Encounter (HOSPITAL_COMMUNITY): Payer: Self-pay

## 2020-02-11 DIAGNOSIS — R0789 Other chest pain: Secondary | ICD-10-CM

## 2020-02-11 DIAGNOSIS — J069 Acute upper respiratory infection, unspecified: Secondary | ICD-10-CM | POA: Diagnosis present

## 2020-02-11 DIAGNOSIS — Z793 Long term (current) use of hormonal contraceptives: Secondary | ICD-10-CM | POA: Insufficient documentation

## 2020-02-11 DIAGNOSIS — R0981 Nasal congestion: Secondary | ICD-10-CM | POA: Insufficient documentation

## 2020-02-11 DIAGNOSIS — Z20822 Contact with and (suspected) exposure to covid-19: Secondary | ICD-10-CM | POA: Diagnosis not present

## 2020-02-11 DIAGNOSIS — R070 Pain in throat: Secondary | ICD-10-CM

## 2020-02-11 DIAGNOSIS — Z7984 Long term (current) use of oral hypoglycemic drugs: Secondary | ICD-10-CM | POA: Diagnosis not present

## 2020-02-11 LAB — SARS CORONAVIRUS 2 (TAT 6-24 HRS): SARS Coronavirus 2: NEGATIVE

## 2020-02-11 MED ORDER — PROMETHAZINE-DM 6.25-15 MG/5ML PO SYRP: 5.0000 mL | ORAL_SOLUTION | Freq: Every evening | ORAL | 0 refills | Status: AC | PRN

## 2020-02-11 MED ORDER — BENZONATATE 100 MG PO CAPS: 100.0000 mg | ORAL_CAPSULE | Freq: Three times a day (TID) | ORAL | 0 refills | Status: AC | PRN

## 2020-02-11 MED ORDER — PSEUDOEPHEDRINE HCL 30 MG PO TABS
30.0000 mg | ORAL_TABLET | Freq: Three times a day (TID) | ORAL | 0 refills | Status: AC | PRN
Start: 2020-02-11 — End: ?

## 2020-02-11 MED ORDER — CETIRIZINE HCL 10 MG PO TABS: 10.0000 mg | ORAL_TABLET | Freq: Every day | ORAL | 0 refills | Status: AC

## 2020-02-11 NOTE — Discharge Instructions (Addendum)
We will notify you of your COVID-19 test results as they arrive and may take between 24 to 48 hours.  I encourage you to sign up for MyChart if you have not already done so as this can be the easiest way for us to communicate results to you online or through a phone app.  In the meantime, if you develop worsening symptoms including fever, chest pain, shortness of breath despite our current treatment plan then please report to the emergency room as this may be a sign of worsening status from possible COVID-19 infection.  Otherwise, we will manage this as a viral syndrome. For sore throat or cough try using a honey-based tea. Use 3 teaspoons of honey with juice squeezed from half lemon. Place shaved pieces of ginger into 1/2-1 cup of water and warm over stove top. Then mix the ingredients and repeat every 4 hours as needed. Please take Tylenol 500mg-650mg every 6 hours for aches and pains, fevers. Hydrate very well with at least 2 liters of water. Eat light meals such as soups to replenish electrolytes and soft fruits, veggies. Start an antihistamine like Zyrtec, Allegra or Claritin for postnasal drainage, sinus congestion.  You can take this together with pseudoephedrine (Sudafed) at a dose of 30-60 mg 2-3 times a day as needed for the same kind of congestion.    

## 2020-02-11 NOTE — ED Provider Notes (Signed)
[facility]   MRN: [id number] DOB: 1990/10/24  Subjective:   Alyssa Lynch is a 29 y.o. female presenting for 3 day hx acute onset malaise and fatigue. Complete ROS below. Has not tried medications for relief.   No current facility-administered medications for this encounter.  Current Outpatient Medications:    medroxyPROGESTERone (PROVERA) 10 MG tablet, Take 1 tablet daily for 10 days every 30 days if no spontaneous menses and negative home urine pregnancy test, Disp: 10 tablet, Rfl: 3   metFORMIN (GLUCOPHAGE-XR) 500 MG 24 hr tablet, Take 1,000 mg by mouth 2 (two) times daily., Disp: , Rfl:    No Known Allergies  History reviewed. No pertinent past medical history.   History reviewed. No pertinent surgical history.  Family History  Problem Relation Age of Onset   Hypertension Maternal Grandmother    Diabetes Maternal Grandmother    Hypertension Paternal Grandmother    Diabetes Paternal Grandmother     Social History   Tobacco Use   Smoking status: Never Smoker   Smokeless tobacco: Never Used  Substance Use Topics   Alcohol use: Yes    Alcohol/week: 0.0 standard drinks    Comment: socially   Drug use: No    Review of Systems  Constitutional: Positive for malaise/fatigue. Negative for fever.  HENT: Positive for congestion and sore throat. Negative for ear pain and sinus pain.   Eyes: Negative for discharge and redness.  Respiratory: Positive for cough and shortness of breath (From coughing fit last night). Negative for hemoptysis and wheezing.   Cardiovascular: Positive for chest pain.  Gastrointestinal: Negative for abdominal pain, diarrhea, nausea and vomiting.  Genitourinary: Negative for dysuria, flank pain and hematuria.  Musculoskeletal: Negative for myalgias.  Skin: Negative for rash.  Neurological: Negative for dizziness, weakness and headaches.  Psychiatric/Behavioral: Negative for depression and substance abuse.      Objective:   Vitals: BP (!) 129/92    Pulse 78    Temp (!) 97.4 F (36.3 C) (Oral)    Resp 16    Ht 5\' 2"  (1.575 m)    Wt 170 lb (77.1 kg)    SpO2 96%    BMI 31.09 kg/m   Physical Exam Constitutional:      General: She is not in acute distress.    Appearance: Normal appearance. She is well-developed. She is not ill-appearing, toxic-appearing or diaphoretic.  HENT:     Head: Normocephalic and atraumatic.     Right Ear: Tympanic membrane and ear canal normal. No drainage or tenderness. No middle ear effusion. Tympanic membrane is not erythematous.     Left Ear: Tympanic membrane and ear canal normal. No drainage or tenderness.  No middle ear effusion. Tympanic membrane is not erythematous.     Nose: Rhinorrhea present.     Mouth/Throat:     Mouth: Mucous membranes are moist. No oral lesions.     Pharynx: Oropharynx is clear. No pharyngeal swelling, oropharyngeal exudate, posterior oropharyngeal erythema or uvula swelling.     Tonsils: No tonsillar exudate or tonsillar abscesses.  Eyes:     Extraocular Movements: Extraocular movements intact.     Right eye: Normal extraocular motion.     Left eye: Normal extraocular motion.     Conjunctiva/sclera: Conjunctivae normal.     Pupils: Pupils are equal, round, and reactive to light.  Cardiovascular:     Rate and Rhythm: Normal rate and regular rhythm.     Pulses: Normal pulses.     Heart sounds: Normal  heart sounds. No murmur heard.  No friction rub. No gallop.   Pulmonary:     Effort: Pulmonary effort is normal. No respiratory distress.     Breath sounds: Normal breath sounds. No stridor. No wheezing, rhonchi or rales.  Musculoskeletal:     Cervical back: Normal range of motion and neck supple.  Lymphadenopathy:     Cervical: No cervical adenopathy.  Skin:    General: Skin is warm and dry.     Findings: No rash.  Neurological:     General: No focal deficit present.     Mental Status: She is alert and oriented to person,  place, and time.  Psychiatric:        Mood and Affect: Mood normal.        Behavior: Behavior normal.        Thought Content: Thought content normal.       Assessment and Plan :   PDMP not reviewed this encounter.  1. Viral URI with cough   2. Sinus congestion   3. Atypical chest pain   4. Throat pain     Will manage for viral illness such as viral URI, viral syndrome, viral rhinitis, COVID-19. Counseled patient on nature of COVID-19 including modes of transmission, diagnostic testing, management and supportive care.  Offered scripts for symptomatic relief. COVID 19 testing is pending. Counseled patient on potential for adverse effects with medications prescribed/recommended today, ER and return-to-clinic precautions discussed, patient verbalized understanding.     Wallis Bamberg, PA-C 02/11/20 1136

## 2020-02-11 NOTE — ED Triage Notes (Signed)
Pt c/o non productive cough, sore throat, nasal drainage and chest painful from coughingx3 days. PT had SOB last night due to all of the coughing. Pt denies SOB at this time. Pt has non labored breathing.
